# Patient Record
Sex: Female | Born: 1955 | Race: Black or African American | Hispanic: No | Marital: Single | Smoking: Never smoker
Health system: Southern US, Community
[De-identification: ages and names within clinical notes are randomized; demographics above are authoritative.]

## PROBLEM LIST (undated history)

## (undated) DIAGNOSIS — E7800 Pure hypercholesterolemia, unspecified: Secondary | ICD-10-CM

## (undated) DIAGNOSIS — I10 Essential (primary) hypertension: Secondary | ICD-10-CM

## (undated) DIAGNOSIS — E119 Type 2 diabetes mellitus without complications: Secondary | ICD-10-CM

## (undated) HISTORY — PX: ABDOMINAL HYSTERECTOMY: SHX81

---

## 2008-08-09 ENCOUNTER — Emergency Department (HOSPITAL_COMMUNITY): Admission: EM | Admit: 2008-08-09 | Discharge: 2008-08-09 | Payer: Self-pay | Admitting: Emergency Medicine

## 2008-11-12 ENCOUNTER — Emergency Department (HOSPITAL_COMMUNITY): Admission: EM | Admit: 2008-11-12 | Discharge: 2008-11-12 | Payer: Self-pay | Admitting: Emergency Medicine

## 2010-10-17 LAB — URINE MICROSCOPIC-ADD ON

## 2010-10-17 LAB — URINALYSIS, ROUTINE W REFLEX MICROSCOPIC
Bilirubin Urine: NEGATIVE
Glucose, UA: NEGATIVE mg/dL
Specific Gravity, Urine: 1.02 (ref 1.005–1.030)
pH: 6 (ref 5.0–8.0)

## 2010-10-17 LAB — URINE CULTURE: Colony Count: NO GROWTH

## 2018-11-15 ENCOUNTER — Encounter (HOSPITAL_COMMUNITY): Payer: Self-pay | Admitting: Emergency Medicine

## 2018-11-15 ENCOUNTER — Other Ambulatory Visit: Payer: Self-pay

## 2018-11-15 ENCOUNTER — Emergency Department (HOSPITAL_COMMUNITY): Payer: Medicare Other

## 2018-11-15 ENCOUNTER — Emergency Department (HOSPITAL_COMMUNITY)
Admission: EM | Admit: 2018-11-15 | Discharge: 2018-11-15 | Disposition: A | Discharge status: Home / Self Care | Payer: Medicare Other | Attending: Emergency Medicine | Admitting: Emergency Medicine

## 2018-11-15 DIAGNOSIS — Z79899 Other long term (current) drug therapy: Secondary | ICD-10-CM | POA: Insufficient documentation

## 2018-11-15 DIAGNOSIS — M25512 Pain in left shoulder: Secondary | ICD-10-CM | POA: Diagnosis not present

## 2018-11-15 DIAGNOSIS — M542 Cervicalgia: Secondary | ICD-10-CM | POA: Insufficient documentation

## 2018-11-15 DIAGNOSIS — E119 Type 2 diabetes mellitus without complications: Secondary | ICD-10-CM | POA: Insufficient documentation

## 2018-11-15 HISTORY — DX: Pure hypercholesterolemia, unspecified: E78.00

## 2018-11-15 HISTORY — DX: Essential (primary) hypertension: I10

## 2018-11-15 HISTORY — DX: Type 2 diabetes mellitus without complications: E11.9

## 2018-11-15 MED ORDER — PREDNISONE 10 MG PO TABS: ORAL_TABLET | ORAL | 0 refills | Status: AC

## 2018-11-15 MED ORDER — DICLOFENAC SODIUM 50 MG PO TBEC: 50.0000 mg | DELAYED_RELEASE_TABLET | Freq: Two times a day (BID) | ORAL | 0 refills | Status: AC

## 2018-11-15 NOTE — Discharge Instructions (Addendum)
Neck x-ray showed no obvious serious problems.  I suspect this is an irritated nerve stone in your neck and traveling to your shoulder.  Prescription for prednisone and anti-inflammatory medicine.  Follow-up with your doctor at home.

## 2018-11-15 NOTE — ED Provider Notes (Signed)
[facility] EMERGENCY DEPARTMENT Provider Note   CSN: [id number] Arrival date & time: 11/15/18  [id number]    History   Chief Complaint Chief Complaint  Patient presents with  . Neck Pain    HPI Maria Butler is a 63 y.o. female.     Left lateral neck pain with radiation to the tip of the left shoulder for the past year, worse the past few weeks.  She is done a lot of lifting and bending in the past.  No trauma.  No meningeal signs or frank neurological deficits.  She has tried over-the-counter medications with minimal relief.  Severity is mild to moderate.  Position and palpation make pain worse.     Past Medical History:  Diagnosis Date  . Diabetes mellitus without complication (HCC)    borderline  . Hypercholesteremia   . Hypertension     There are no active problems to display for this patient.   Past Surgical History:  Procedure Laterality Date  . ABDOMINAL HYSTERECTOMY       OB History   No obstetric history on file.      Home Medications    Prior to Admission medications   Medication Sig Start Date End Date Taking? Authorizing Provider  atorvastatin (LIPITOR) 40 MG tablet Take 40 mg by mouth daily. 09/28/18  Yes [provider]  cholecalciferol (VITAMIN D) 25 MCG (1000 UT) tablet Take 1 tablet by mouth every other day.   Yes [provider]  lisinopril-hydrochlorothiazide (ZESTORETIC) 10-12.5 MG tablet Take 1 tablet by mouth daily. 10/06/18  Yes [provider]  vitamin B-12 (CYANOCOBALAMIN) 100 MCG tablet Take 1 tablet by mouth daily.   Yes [provider]  diclofenac (VOLTAREN) 50 MG EC tablet Take 1 tablet (50 mg total) by mouth 2 (two) times daily. 11/15/18   Donnetta Hutchingook, Kandy Towery, MD  predniSONE (DELTASONE) 10 MG tablet 3 tabs for 3 days, 2 tabs for 3 days, 1 tab for 3 days. 11/15/18   Donnetta Hutchingook, Marquize Seib, MD    Family History History reviewed. No pertinent family history.  Social History Social History   Tobacco Use  . Smoking  status: Never Smoker  . Smokeless tobacco: Never Used  Substance Use Topics  . Alcohol use: Never    Frequency: Never  . Drug use: Never     Allergies   Erythromycin and Sulfamethoxazole-trimethoprim   Review of Systems Review of Systems  All other systems reviewed and are negative.    Physical Exam Updated Vital Signs BP 135/73   Pulse 70   Temp 97.9 F (36.6 C) (Oral)   Resp 16   Ht 4\' 10"  (1.473 m)   Wt 65.8 kg   SpO2 99%   BMI 30.31 kg/m   Physical Exam Vitals signs and nursing note reviewed.  Constitutional:      Appearance: She is well-developed.  HENT:     Head: Normocephalic and atraumatic.  Eyes:     Conjunctiva/sclera: Conjunctivae normal.  Neck:     Musculoskeletal: Neck supple.     Comments: Tender inferior lateral left neck Cardiovascular:     Rate and Rhythm: Normal rate and regular rhythm.  Pulmonary:     Effort: Pulmonary effort is normal.     Breath sounds: Normal breath sounds.  Abdominal:     General: Bowel sounds are normal.     Palpations: Abdomen is soft.  Musculoskeletal:     Comments: Pain with range of motion of the left shoulder.  Skin:  General: Skin is warm and dry.  Neurological:     Mental Status: She is alert and oriented to person, place, and time.  Psychiatric:        Behavior: Behavior normal.      ED Treatments / Results  Labs (all labs ordered are listed, but only abnormal results are displayed) Labs Reviewed - No data to display  EKG None  Radiology Dg Cervical Spine Complete  Result Date: 11/15/2018 CLINICAL DATA:  Chronic pain in left neck and shoulder, recently worsening. EXAM: CERVICAL SPINE - COMPLETE 4+ VIEW COMPARISON:  None. FINDINGS: The cervical spine is only well seen to the bottom of C6 on the lateral view. C7 in the cervicothoracic junction are not well assessed. No malalignment. No acute fractures identified. The pre odontoid space and prevertebral soft tissues are normal. Facet degenerative  changes are noted. The neural foramina appear patent. The lateral masses of C1 align with C2. The odontoid process is unremarkable. IMPRESSION: Facet degenerative changes.  No other acute abnormalities. Electronically Signed   By: Gerome Sam III M.D   On: 11/15/2018 09:33    Procedures Procedures (including critical care time)  Medications Ordered in ED Medications - No data to display   Initial Impression / Assessment and Plan / ED Course  I have reviewed the triage vital signs and the nursing notes.  Pertinent labs & imaging results that were available during my care of the patient were reviewed by me and considered in my medical decision making (see chart for details).       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  Plain films cervical spine negative.  Discharge medication prednisone and Voltaren 50 mg.  Final Clinical Impressions(s) / ED Diagnoses   Final diagnoses:  Neck pain  Left shoulder pain, unspecified chronicity    ED Discharge Orders         Ordered    predniSONE (DELTASONE) 10 MG tablet     11/15/18 0947    diclofenac (VOLTAREN) 50 MG EC tablet  2 times daily     11/15/18 0947           Donnetta Hutching, MD 11/15/18 1224

## 2018-11-15 NOTE — ED Triage Notes (Signed)
Pt states her left neck into left shoulder has been hurting since last year but has been getting worse making it hard to sleep.

## 2019-12-09 IMAGING — DX CERVICAL SPINE - COMPLETE 4+ VIEW
6 series · 6 of 6 positions shown · non-contrast
Comparison: None.

CLINICAL DATA: Chronic pain in left neck and shoulder, recently
worsening.

EXAM:
CERVICAL SPINE - COMPLETE 4+ VIEW

[c-spine lat]
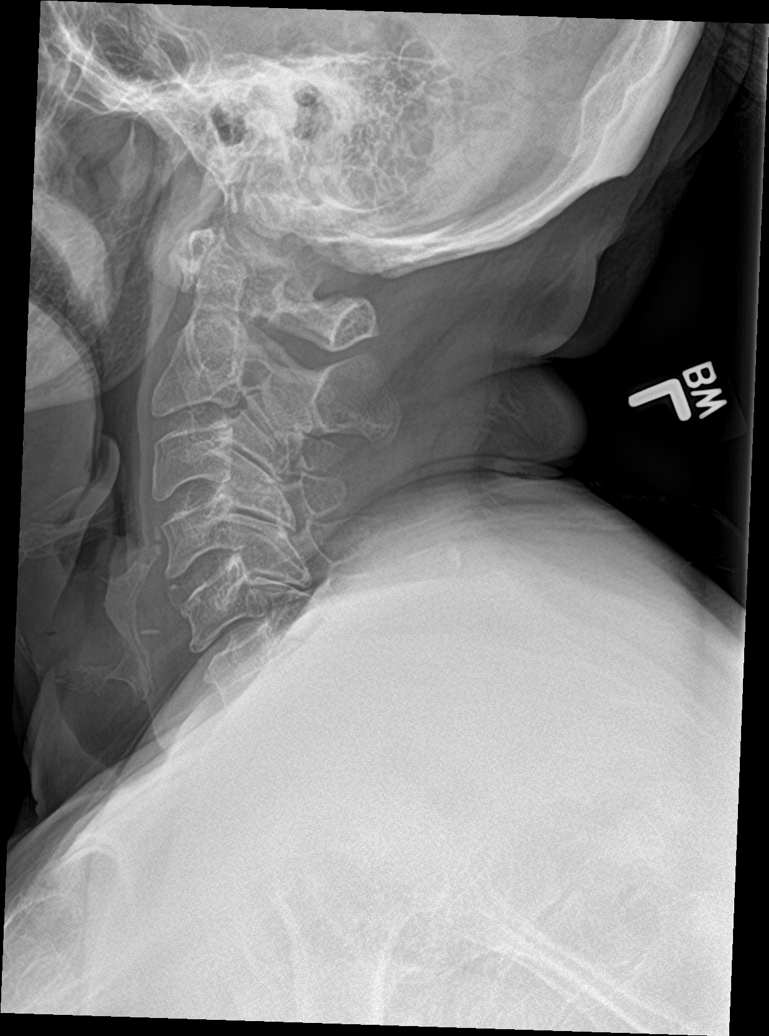

[c-spine obl (1 of 2)]
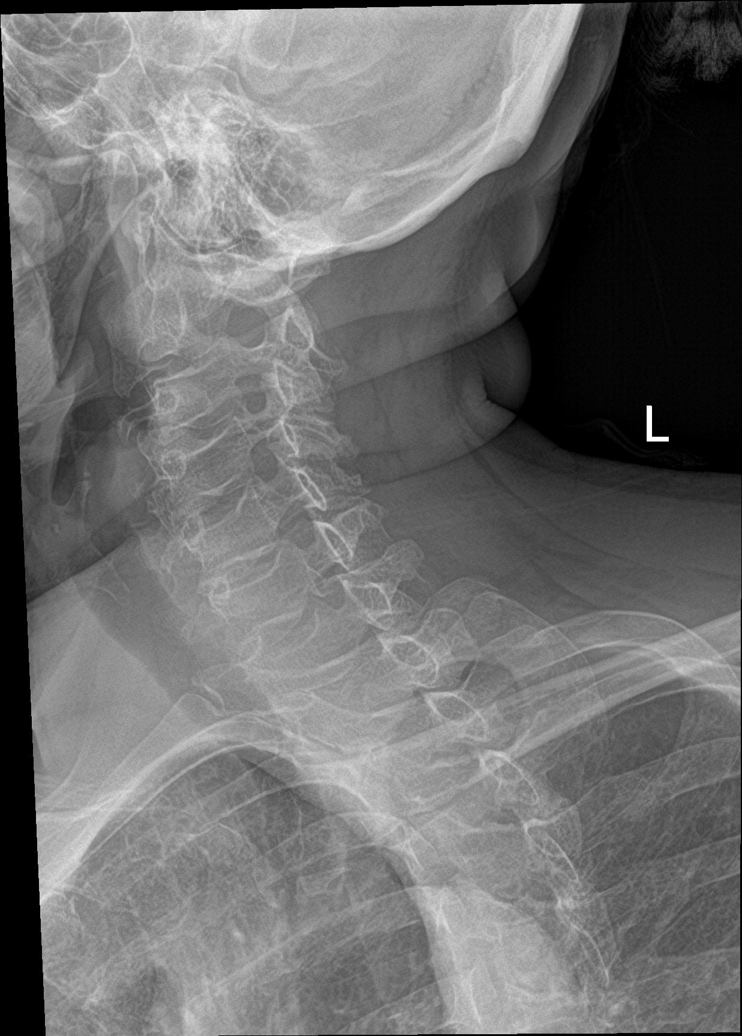

[c-spine obl (2 of 2)]
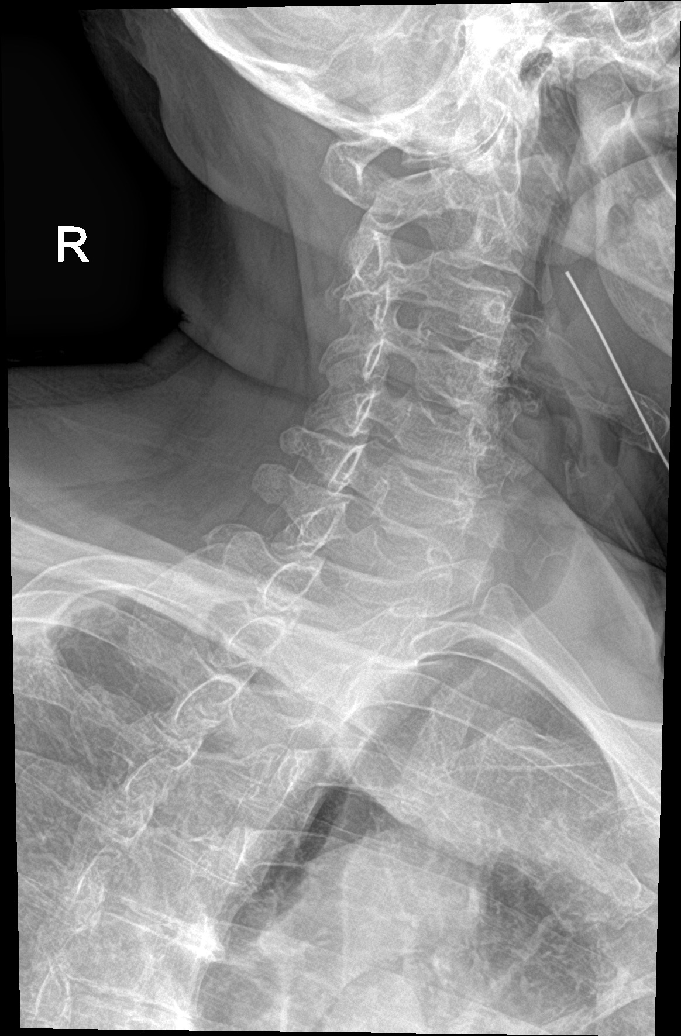

[c-spine ap]
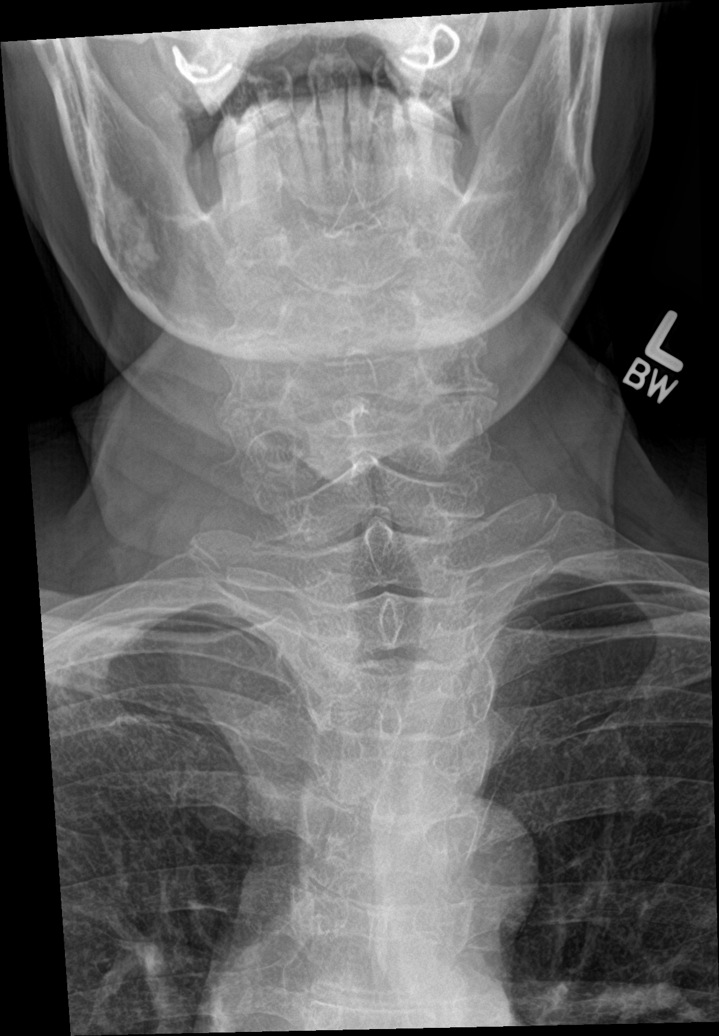

[c-spine open mouth]
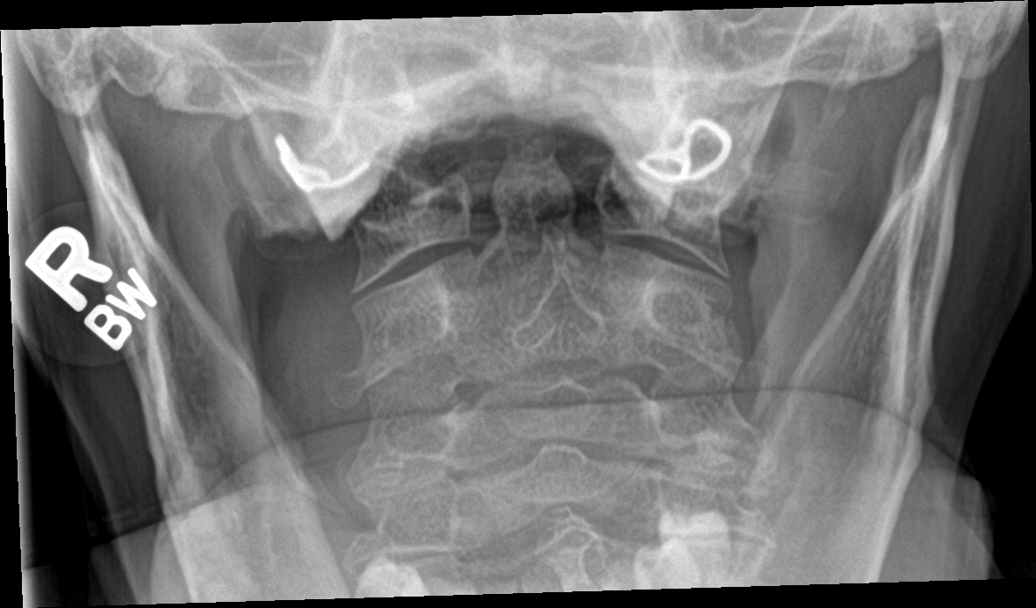

[c-spine swimmers trauma]
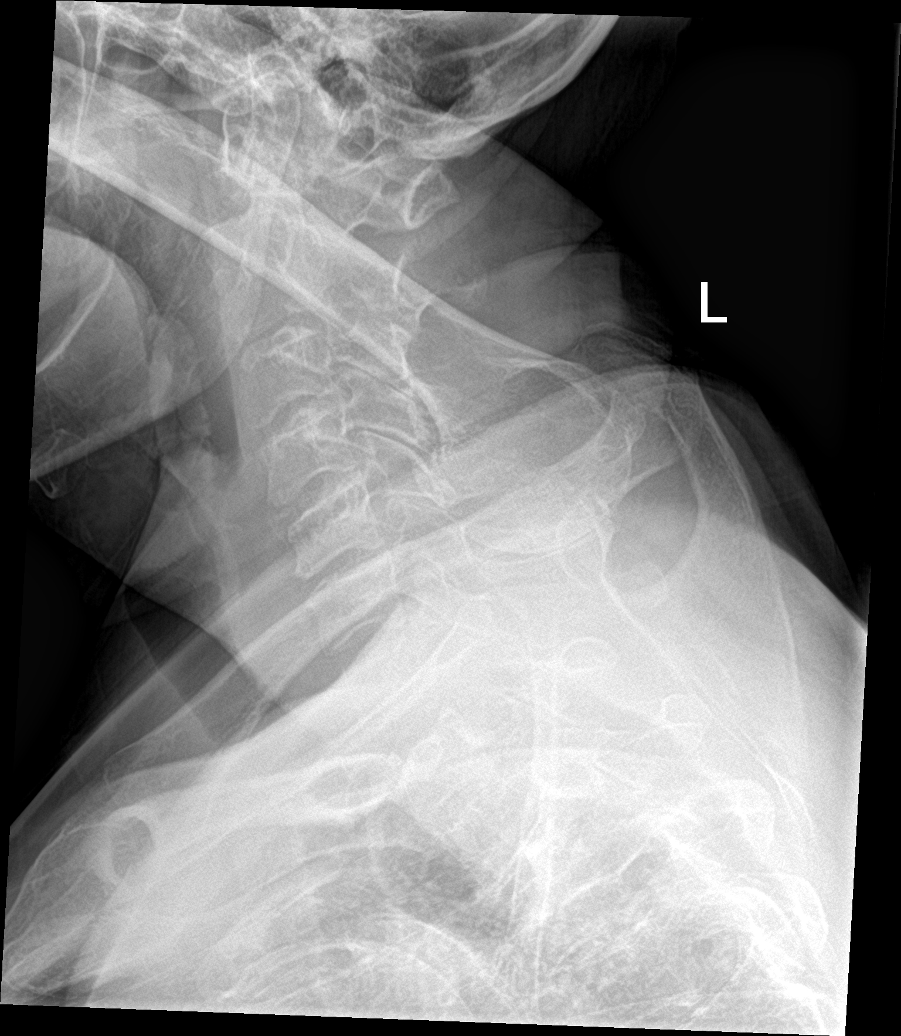

[6 of 6 positions shown; findings below may reference images not displayed]

FINDINGS: The cervical spine is only well seen to the bottom of C6 on the
lateral view. C7 in the cervicothoracic junction are not well
assessed. No malalignment. No acute fractures identified. The pre
odontoid space and prevertebral soft tissues are normal. Facet
degenerative changes are noted. The neural foramina appear patent.
The lateral masses of C1 align with C2. The odontoid process is
unremarkable.
IMPRESSION: Facet degenerative changes.  No other acute abnormalities.
